# Patient Record
Sex: Male | Born: 1990 | Race: White | Hispanic: No | Marital: Single | Smoking: Never smoker
Health system: Southern US, Community
[De-identification: ages and names within clinical notes are randomized; demographics above are authoritative.]

## PROBLEM LIST (undated history)

## (undated) DIAGNOSIS — Q7962 Hypermobile Ehlers-Danlos syndrome: Secondary | ICD-10-CM

## (undated) DIAGNOSIS — I10 Essential (primary) hypertension: Secondary | ICD-10-CM

## (undated) DIAGNOSIS — G629 Polyneuropathy, unspecified: Secondary | ICD-10-CM

## (undated) DIAGNOSIS — M25561 Pain in right knee: Secondary | ICD-10-CM

## (undated) DIAGNOSIS — R200 Anesthesia of skin: Secondary | ICD-10-CM

## (undated) DIAGNOSIS — M249 Joint derangement, unspecified: Secondary | ICD-10-CM

## (undated) DIAGNOSIS — R0689 Other abnormalities of breathing: Secondary | ICD-10-CM

## (undated) DIAGNOSIS — E669 Obesity, unspecified: Secondary | ICD-10-CM

## (undated) DIAGNOSIS — E66811 Obesity, class 1: Secondary | ICD-10-CM

## (undated) DIAGNOSIS — M542 Cervicalgia: Secondary | ICD-10-CM

## (undated) DIAGNOSIS — M549 Dorsalgia, unspecified: Secondary | ICD-10-CM

## (undated) DIAGNOSIS — G8929 Other chronic pain: Secondary | ICD-10-CM

## (undated) DIAGNOSIS — F411 Generalized anxiety disorder: Secondary | ICD-10-CM

## (undated) DIAGNOSIS — M25562 Pain in left knee: Secondary | ICD-10-CM

## (undated) DIAGNOSIS — M5412 Radiculopathy, cervical region: Secondary | ICD-10-CM

## (undated) DIAGNOSIS — J309 Allergic rhinitis, unspecified: Secondary | ICD-10-CM

## (undated) DIAGNOSIS — S83003A Unspecified subluxation of unspecified patella, initial encounter: Secondary | ICD-10-CM

## (undated) HISTORY — DX: Anesthesia of skin: R20.0

## (undated) HISTORY — DX: Unspecified subluxation of unspecified patella, initial encounter: S83.003A

## (undated) HISTORY — DX: Pain in left knee: M25.562

## (undated) HISTORY — DX: Cervicalgia: M54.2

## (undated) HISTORY — DX: Obesity, unspecified: E66.9

## (undated) HISTORY — PX: MENISCUS REPAIR: SHX5179

## (undated) HISTORY — DX: Obesity, class 1: E66.811

## (undated) HISTORY — DX: Radiculopathy, cervical region: M54.12

## (undated) HISTORY — DX: Joint derangement, unspecified: M24.9

## (undated) HISTORY — DX: Other abnormalities of breathing: R06.89

## (undated) HISTORY — DX: Allergic rhinitis, unspecified: J30.9

## (undated) HISTORY — DX: Dorsalgia, unspecified: M54.9

## (undated) HISTORY — DX: Other chronic pain: G89.29

## (undated) HISTORY — DX: Polyneuropathy, unspecified: G62.9

## (undated) HISTORY — DX: Pain in right knee: M25.561

## (undated) HISTORY — DX: Generalized anxiety disorder: F41.1

---
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
## 2012-05-27 DIAGNOSIS — M228X9 Other disorders of patella, unspecified knee: Secondary | ICD-10-CM | POA: Insufficient documentation

## 2012-05-27 DIAGNOSIS — M249 Joint derangement, unspecified: Secondary | ICD-10-CM | POA: Insufficient documentation

## 2012-05-27 DIAGNOSIS — E669 Obesity, unspecified: Secondary | ICD-10-CM | POA: Insufficient documentation

## 2012-06-17 DIAGNOSIS — M25376 Other instability, unspecified foot: Secondary | ICD-10-CM | POA: Insufficient documentation

## 2012-09-03 DIAGNOSIS — M25369 Other instability, unspecified knee: Secondary | ICD-10-CM | POA: Insufficient documentation

## 2012-10-06 ENCOUNTER — Emergency Department: Payer: Self-pay | Admitting: Internal Medicine

## 2012-10-06 LAB — URINALYSIS, COMPLETE
Bilirubin,UR: NEGATIVE
Glucose,UR: NEGATIVE mg/dL (ref 0–75)
Leukocyte Esterase: NEGATIVE
Nitrite: NEGATIVE
Ph: 5 (ref 4.5–8.0)
RBC,UR: 1 /HPF (ref 0–5)
Specific Gravity: 1.031 (ref 1.003–1.030)

## 2012-10-06 LAB — CBC
HCT: 46.6 % (ref 40.0–52.0)
HGB: 15.6 g/dL (ref 13.0–18.0)
MCHC: 33.5 g/dL (ref 32.0–36.0)

## 2012-10-06 LAB — COMPREHENSIVE METABOLIC PANEL
Albumin: 3.8 g/dL (ref 3.4–5.0)
Alkaline Phosphatase: 73 U/L (ref 50–136)
BUN: 14 mg/dL (ref 7–18)
Bilirubin,Total: 0.4 mg/dL (ref 0.2–1.0)
Calcium, Total: 9 mg/dL (ref 8.5–10.1)
Co2: 27 mmol/L (ref 21–32)
EGFR (African American): >60
Osmolality: 279 (ref 275–301)
Potassium: 4.2 mmol/L (ref 3.5–5.1)

## 2013-03-10 DIAGNOSIS — S83003A Unspecified subluxation of unspecified patella, initial encounter: Secondary | ICD-10-CM | POA: Insufficient documentation

## 2013-03-10 DIAGNOSIS — F411 Generalized anxiety disorder: Secondary | ICD-10-CM | POA: Insufficient documentation

## 2013-05-01 DIAGNOSIS — R200 Anesthesia of skin: Secondary | ICD-10-CM | POA: Insufficient documentation

## 2013-06-16 DIAGNOSIS — G629 Polyneuropathy, unspecified: Secondary | ICD-10-CM | POA: Insufficient documentation

## 2013-07-02 DIAGNOSIS — Q796 Ehlers-Danlos syndrome, unspecified: Secondary | ICD-10-CM | POA: Insufficient documentation

## 2013-07-23 DIAGNOSIS — M549 Dorsalgia, unspecified: Secondary | ICD-10-CM | POA: Insufficient documentation

## 2013-07-23 DIAGNOSIS — M542 Cervicalgia: Secondary | ICD-10-CM | POA: Insufficient documentation

## 2013-07-23 DIAGNOSIS — M40204 Unspecified kyphosis, thoracic region: Secondary | ICD-10-CM | POA: Insufficient documentation

## 2013-07-25 DIAGNOSIS — M25561 Pain in right knee: Secondary | ICD-10-CM

## 2013-07-25 DIAGNOSIS — M25562 Pain in left knee: Secondary | ICD-10-CM

## 2013-07-25 DIAGNOSIS — G8929 Other chronic pain: Secondary | ICD-10-CM | POA: Insufficient documentation

## 2013-07-25 DIAGNOSIS — M25361 Other instability, right knee: Secondary | ICD-10-CM | POA: Insufficient documentation

## 2014-08-04 ENCOUNTER — Emergency Department
Admission: EM | Admit: 2014-08-04 | Discharge: 2014-08-04 | Disposition: A | Discharge status: Home / Self Care | Payer: Self-pay | Attending: Emergency Medicine | Admitting: Emergency Medicine

## 2014-08-04 ENCOUNTER — Encounter: Payer: Self-pay | Admitting: Emergency Medicine

## 2014-08-04 ENCOUNTER — Emergency Department: Payer: Self-pay

## 2014-08-04 DIAGNOSIS — R200 Anesthesia of skin: Secondary | ICD-10-CM | POA: Insufficient documentation

## 2014-08-04 DIAGNOSIS — I10 Essential (primary) hypertension: Secondary | ICD-10-CM | POA: Insufficient documentation

## 2014-08-04 DIAGNOSIS — M542 Cervicalgia: Secondary | ICD-10-CM | POA: Insufficient documentation

## 2014-08-04 DIAGNOSIS — Z79899 Other long term (current) drug therapy: Secondary | ICD-10-CM | POA: Insufficient documentation

## 2014-08-04 DIAGNOSIS — Z880 Allergy status to penicillin: Secondary | ICD-10-CM | POA: Insufficient documentation

## 2014-08-04 HISTORY — DX: Hypermobile Ehlers-Danlos syndrome: Q79.62

## 2014-08-04 HISTORY — DX: Essential (primary) hypertension: I10

## 2014-08-04 LAB — BASIC METABOLIC PANEL
ANION GAP: 5 (ref 5–15)
BUN: 12 mg/dL (ref 6–20)
CHLORIDE: 106 mmol/L (ref 101–111)
CO2: 30 mmol/L (ref 22–32)
CREATININE: 1.21 mg/dL (ref 0.61–1.24)
Calcium: 9.1 mg/dL (ref 8.9–10.3)
GFR calc Af Amer: >60 mL/min (ref >60)
GFR calc non Af Amer: >60 mL/min (ref >60)
Glucose, Bld: 88 mg/dL (ref 65–99)
Potassium: 4.4 mmol/L (ref 3.5–5.1)
SODIUM: 141 mmol/L (ref 135–145)

## 2014-08-04 NOTE — ED Provider Notes (Signed)
[facility] Emergency Department Provider Note  ____________________________________________  Time seen: Approximately 5:04 PM  I have reviewed the triage vital signs and the nursing notes.   HISTORY  Chief Complaint Neck Pain and Numbness   HPI Delawrence Fridman is a 24 y.o. 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 He has had multiple joint injuries and compression fractures in his spine in the past. He woke up 3 days ago with pain in his neck and left arm numbness and tingling like he went to sleep. It has not responded over service treatment at home over the last 3 days. Patient does have a prior history of facet joint impingement in the neck. Grandparents are worried about further injury involving the nerves or spinal cord and his neck due to his illness. Patient has pain on raising his hand over his head. There is no apparent weakness.   Past Medical History  Diagnosis Date  . Ehlers-Danlos syndrome type III   . Hypertension     There are no active problems to display for this patient.   History reviewed. No pertinent past surgical history.  Current Outpatient Rx  Name  Route  Sig  Dispense  Refill  . budesonide (RHINOCORT ALLERGY) 32 MCG/ACT nasal spray   Each Nare   Place 1 spray into both nostrils daily.         Marland Kitchen desloratadine (CLARINEX) 5 MG tablet   Oral   Take 5 mg by mouth daily.         Marland Kitchen ibuprofen (ADVIL,MOTRIN) 200 MG tablet   Oral   Take 400 mg by mouth every 6 (six) hours as needed for headache or mild pain.         . montelukast (SINGULAIR) 10 MG tablet   Oral   Take 10 mg by mouth daily.           Allergies Augmentin  History reviewed. No pertinent family history.  Social History History  Substance Use Topics  . Smoking status: Never Smoker   . Smokeless tobacco: Not on file  . Alcohol Use: No    Review of Systems Constitutional: No  fever/chills Eyes: No visual changes. ENT: No sore throat. Cardiovascular: Denies chest pain. Respiratory: Denies shortness of breath. Gastrointestinal: No abdominal pain.  No nausea, no vomiting.  No diarrhea.  No constipation. Genitourinary: Negative for dysuria. Musculoskeletal: Negative for back pain. Skin: Negative for rash. Neurological: Negative for headaches, focal weakness or numbness.  10-point ROS otherwise negative.  ____________________________________________   PHYSICAL EXAM:  VITAL SIGNS: ED Triage Vitals  Enc Vitals Group     BP 08/04/14 1559 120/70 mmHg     Pulse Rate 08/04/14 1559 81     Resp 08/04/14 1559 18     Temp 08/04/14 1559 98.4 F (36.9 C)     Temp Source 08/04/14 1559 Oral     SpO2 08/04/14 1559 98 %     Weight 08/04/14 1559 284 lb (128.822 kg)     Height 08/04/14 1559  (1.854 m)     Head Cir --      Peak Flow --      Pain Score 08/04/14 1600 6     Pain Loc --      Pain Edu? --      Excl. in GC? --     Constitutional: Alert and oriented. Well appearing and in no acute distress. Eyes: Conjunctivae are normal.  PERRL. EOMI. Head: Atraumatic. Nose: No congestion/rhinnorhea. Mouth/Throat: Mucous membranes are moist.  Oropharynx non-erythematous. Neck: No stridor. Patient has tenderness in the midline on palpation of his neck at approximately the C4 area Cardiovascular: Normal rate, regular rhythm. Grossly normal heart sounds.  Good peripheral circulation. Respiratory: Normal respiratory effort.  No retractions. Lungs CTAB. Gastrointestinal: Soft and nontender. No distention. No abdominal bruits. No CVA tenderness. Musculoskeletal: No lower extremity tenderness nor edema.  No joint effusions. Neurologic:  Normal speech and language. No gross focal neurologic deficits are appreciated especially in the arms and hands. Speech is normal. No gait instability. Skin:  Skin is warm, dry and intact. No rash noted. Psychiatric: Mood and affect are  normal. Speech and behavior are normal.  ____________________________________________   LABS (all labs ordered are listed, but only abnormal results are displayed)  Labs Reviewed  BASIC METABOLIC PANEL   ____________________________________________  EKG   ____________________________________________  RADIOLOGY  MRI findings reviewed and discussed with Dr. Arlana Pouchate is [facility] she feels most likely this is a cord contusion and she feels that would most likely be evaluated by neurosurgery and if there is no ligamentous or bony injury probably nothing would be done. She thinks she would be very high-grade coincidence if this was a transverse myelitis after the positioning that mom described to me just before I called her where he had to lie on his side in the gurney with his left hand under his neck during a angiogram kind of procedure the day before this numbness started. Mother and father have to go to get insulin for the father to [facility] and will make him back I will discuss with them the fact that [facility] on [facility] due to an ER surge problem and see if they want to go to [facility] or [facility] or [facility] or what    ____________________________________________   PROCEDURES  [facility]  ____________________________________________   INITIAL IMPRESSION / ASSESSMENT AND PLAN / ED COURSE  Pertinent labs & imaging results that were available during my care of the patient were reviewed by me and considered in my medical decision making (see chart for details). Discussed in detail with Dr. Leonard [facility]su [facility] he says the patient can follow-up as an outpatient if the only complaints are numbness this was confirmed with the patient and family again and confirmed by my exam he says the patient does not need a neck brace unless he wants to wear one for comfort. He can follow up with the neurosurgery clinic  as an outpatient I obtained a phone number which opened the discharge instructions [facility]  ____________________________________________   FINAL CLINICAL IMPRESSION(S) / ED DIAGNOSES  Final diagnoses:  Arm numbness left      Arnaldo NatalPaul F Malinda, MD 08/04/14 2201

## 2014-08-04 NOTE — ED Notes (Signed)
Dr. Darnelle CatalanMalinda in room to discuss patient's status on transferring to [facility]. Waiting at this time to see if ED is off diversion.

## 2014-08-04 NOTE — Discharge Instructions (Signed)
Please return for further increases in pain or any weakness of the arms. Call [phone] which should be the neurosurgery outpatient clinic and arrange follow[facility]. Please tell them that she was seen in the emergency room [facility] and that the doctor there Dr. Darnelle CatalanMalinda spoke with Dr. Arlana Pouchate in neurology and Dr. Raynald KempHsu  neurosurgery and it was recommended by the neurosurgeon that you follow up in the outpatient clinic for neurosurgery as soon as possible. He feels that the condition since it has been going on for 3 days and there is no muscle weakness you her condition is stable at this time. He feels you do not need to wear a neck brace. If you do get any more numbness or if you get any weakness you need to return to the emergency room at once. He can use Tylenol or Motrin or another non-steroidal like Naprosyn if needed for the discomfort you're having. When you go to see the neurosurgery clinic please take the information from Dr. Peggye FormJewett about Ehlers-Danlos syndrome with you.

## 2014-08-04 NOTE — ED Notes (Signed)
Patient transported to MRI 

## 2014-08-04 NOTE — ED Notes (Signed)
Pt to ed with c/o left arm numbness and neck pain that he awoke with 3 days ago.  Pt denies known injury but has hx of ehlers-danlos and states he could have an injury and not be aware of it.

## 2014-08-07 ENCOUNTER — Telehealth: Payer: Self-pay | Admitting: Emergency Medicine

## 2014-08-07 NOTE — ED Notes (Signed)
Received call from patient's mother on 7/6 requesting referral to [facility] neuro surgery.  Called Dr. Denyse AmassHsu's practice and they do not connect with us on epic at this time.  Faxed notes to Dr. Raynald KempHsu, and office staff will give the notes to him when he is out of surgery.   This am patient's father called, and they have not heard from [facility] about seeing patient.  I told him I had faxed the information ( on 7/6 and received a confirmation.  I told him I would fax the information again.   He called me back and says [facility] said they have centralized fax and have not received the one from 7/6 until it is distributed to them.  Father very frustrated.  I called [facility]urg again and they gave me their office fax [phone] so they can have dr hsu review and decide on follow up plans.  i explained to the office that patietn and family need reassurance if a follow up with Dr. Raynald KempHsu is to be delayed, as the patient was going to be transferred to [facility] from here, but was not due to surge at [facility].

## 2014-10-26 DIAGNOSIS — M5412 Radiculopathy, cervical region: Secondary | ICD-10-CM | POA: Insufficient documentation

## 2015-02-10 DIAGNOSIS — R0689 Other abnormalities of breathing: Secondary | ICD-10-CM | POA: Insufficient documentation

## 2015-06-29 DIAGNOSIS — I7300 Raynaud's syndrome without gangrene: Secondary | ICD-10-CM | POA: Insufficient documentation

## 2016-04-14 ENCOUNTER — Encounter: Payer: Self-pay | Admitting: Emergency Medicine

## 2016-04-14 ENCOUNTER — Emergency Department
Admission: EM | Admit: 2016-04-14 | Discharge: 2016-04-14 | Disposition: A | Discharge status: Home / Self Care | Payer: Self-pay | Attending: Emergency Medicine | Admitting: Emergency Medicine

## 2016-04-14 ENCOUNTER — Emergency Department: Payer: Self-pay

## 2016-04-14 DIAGNOSIS — Z79899 Other long term (current) drug therapy: Secondary | ICD-10-CM | POA: Insufficient documentation

## 2016-04-14 DIAGNOSIS — K429 Umbilical hernia without obstruction or gangrene: Secondary | ICD-10-CM | POA: Insufficient documentation

## 2016-04-14 DIAGNOSIS — I10 Essential (primary) hypertension: Secondary | ICD-10-CM | POA: Insufficient documentation

## 2016-04-14 LAB — COMPREHENSIVE METABOLIC PANEL
ALT: 44 U/L (ref 17–63)
ANION GAP: 6 (ref 5–15)
AST: 28 U/L (ref 15–41)
Albumin: 4.3 g/dL (ref 3.5–5.0)
Alkaline Phosphatase: 56 U/L (ref 38–126)
BUN: 13 mg/dL (ref 6–20)
CHLORIDE: 104 mmol/L (ref 101–111)
CO2: 28 mmol/L (ref 22–32)
CREATININE: 1.04 mg/dL (ref 0.61–1.24)
Calcium: 9.1 mg/dL (ref 8.9–10.3)
GFR calc non Af Amer: >60 mL/min (ref >60)
Glucose, Bld: 94 mg/dL (ref 65–99)
Potassium: 4 mmol/L (ref 3.5–5.1)
Sodium: 138 mmol/L (ref 135–145)
TOTAL PROTEIN: 7.9 g/dL (ref 6.5–8.1)
Total Bilirubin: 0.8 mg/dL (ref 0.3–1.2)

## 2016-04-14 LAB — CBC WITH DIFFERENTIAL/PLATELET
Basophils Absolute: 0.1 10*3/uL (ref 0–0.1)
Basophils Relative: 1 %
EOS ABS: 0 10*3/uL (ref 0–0.7)
EOS PCT: 0 %
HCT: 47.7 % (ref 40.0–52.0)
Hemoglobin: 16.1 g/dL (ref 13.0–18.0)
LYMPHS ABS: 3.2 10*3/uL (ref 1.0–3.6)
Lymphocytes Relative: 27 %
MCH: 28 pg (ref 26.0–34.0)
MCHC: 33.8 g/dL (ref 32.0–36.0)
MCV: 82.8 fL (ref 80.0–100.0)
MONO ABS: 1 10*3/uL (ref 0.2–1.0)
MONOS PCT: 8 %
Neutro Abs: 7.6 10*3/uL — ABNORMAL HIGH (ref 1.4–6.5)
Neutrophils Relative %: 64 %
PLATELETS: 211 10*3/uL (ref 150–440)
RBC: 5.76 MIL/uL (ref 4.40–5.90)
RDW: 14.1 % (ref 11.5–14.5)
WBC: 12 10*3/uL — AB (ref 3.8–10.6)

## 2016-04-14 LAB — LIPASE, BLOOD: LIPASE: 12 U/L (ref 11–51)

## 2016-04-14 MED ORDER — IOPAMIDOL (ISOVUE-300) INJECTION 61%
30.0000 mL | Freq: Once | INTRAVENOUS | Status: AC
  Administered 2016-04-14: 30 mL via ORAL
  Filled 2016-04-14: qty 30

## 2016-04-14 MED ORDER — CEPHALEXIN 500 MG PO CAPS: 500.0000 mg | ORAL_CAPSULE | Freq: Four times a day (QID) | ORAL | 0 refills | Status: AC

## 2016-04-14 MED ORDER — IOPAMIDOL (ISOVUE-300) INJECTION 61%
125.0000 mL | Freq: Once | INTRAVENOUS | Status: AC | PRN
  Administered 2016-04-14: 125 mL via INTRAVENOUS
  Filled 2016-04-14: qty 150

## 2016-04-14 MED ORDER — KETOROLAC TROMETHAMINE 30 MG/ML IJ SOLN
15.0000 mg | Freq: Once | INTRAMUSCULAR | Status: AC
  Administered 2016-04-14: 15 mg via INTRAVENOUS
  Filled 2016-04-14: qty 1

## 2016-04-14 NOTE — ED Notes (Addendum)
Pt states pain in the umbilicus started 2 days ago, pt states feeling a mass behind the umbilicus; pt states umbilicus started bleeding and yellow discharge today. Pt states pain at 7/10. Pt has Ehlers-Danlos Syndrome.

## 2016-04-14 NOTE — ED Provider Notes (Signed)
Time Seen: Approximately 2012  I have reviewed the triage notes  Chief Complaint: Wound Infection   History of Present Illness: Brad Parker is a 26 y.o. male  who presents with some periumbilical abdominal pain over the last 2 days and is noticed a somewhat bloody dried drainage from the umbilicus. He denies any trauma. He states the pain is rated around the umbilicus and has a tendency to descend and points to an area couple of centimeters below the umbilicus. He denies any fever or malaise aware of, no nausea or vomiting.  Past Medical History:  Diagnosis Date  . Ehlers-Danlos syndrome type III   . Hypertension     There are no active problems to display for this patient.   History reviewed. No pertinent surgical history.  History reviewed. No pertinent surgical history.  Current Outpatient Rx  . Order #: [id number] Class: Historical Med  . Order #: [id number] Class: Historical Med  . Order #: [id number] Class: Historical Med  . Order #: [id number] Class: Historical Med    Allergies:  Augmentin [amoxicillin-pot clavulanate]  Family History: History reviewed. No pertinent family history.  Social History: Social History  Substance Use Topics  . Smoking status: Never Smoker  . Smokeless tobacco: Never Used  . Alcohol use No     Review of Systems:   10 point review of systems was performed and was otherwise negative:  Constitutional: No fever Eyes: No visual disturbances ENT: No sore throat, ear pain Cardiac: No chest pain Respiratory: No shortness of breath, wheezing, or stridor Abdomen: Periumbilical abdominal pain without any lower or upper abdominal discomfort. No diarrhea, etc. Endocrine: No weight loss, No night sweats Extremities: No peripheral edema, cyanosis Skin: No rashes, easy bruising Neurologic: No focal weakness, trouble with speech or swollowing Urologic: No dysuria, Hematuria, or urinary frequency *  Physical Exam:  ED Triage Vitals   Enc Vitals Group     BP 04/14/16 1956 125/85     Pulse Rate 04/14/16 1956 67     Resp 04/14/16 1956 20     Temp 04/14/16 1956 98.9 F (37.2 C)     Temp Source 04/14/16 1956 Oral     SpO2 04/14/16 1956 98 %     Weight 04/14/16 2000 300 lb (136.1 kg)     Height 04/14/16 2000 6\' 2"  (1.88 m)     Head Circumference --      Peak Flow --      Pain Score 04/14/16 2000 7     Pain Loc --      Pain Edu? --      Excl. in GC? --     General: Awake , Alert , and Oriented times 3; GCS 15 Head: Normal cephalic , atraumatic Eyes: Pupils equal , round, reactive to light Nose/Throat: No nasal drainage, patent upper airway without erythema or exudate.  Neck: Supple, Full range of motion, No anterior adenopathy or palpable thyroid masses Lungs: Clear to ascultation without wheezes , rhonchi, or rales Heart: Regular rate, regular rhythm without murmurs , gallops , or rubs Abdomen: Soft some mild tenderness with what appears to be old serosanguineous type blood around the umbilicus no bright red blood or ecchymosis noted no rebound, guarding, or rigidity. No focal tenderness over McBurney's point. Negative Murphy's sign .        Extremities: 2 plus symmetric pulses. No edema, clubbing or cyanosis Neurologic: normal ambulation, Motor symmetric without deficits, sensory intact Skin: warm, dry, no rashes   Labs:  All laboratory work was reviewed including any pertinent negatives or positives listed below:  Labs Reviewed  CBC WITH DIFFERENTIAL/PLATELET - Abnormal; Notable for the following:       Result Value   WBC 12.0 (*)    Neutro Abs 7.6 (*)    All other components within normal limits  COMPREHENSIVE METABOLIC PANEL  LIPASE, BLOOD  Laboratory work was reviewed and showed no clinically significant abnormalities.  Radiology: "Ct Abdomen Pelvis W Contrast  Result Date: 04/14/2016 CLINICAL DATA:  Initial evaluation for acute umbilical abdominal pain, drainage. EXAM: CT ABDOMEN AND PELVIS WITH  CONTRAST TECHNIQUE: Multidetector CT imaging of the abdomen and pelvis was performed using the standard protocol following bolus administration of intravenous contrast. CONTRAST:  125mL ISOVUE-300 IOPAMIDOL (ISOVUE-300) INJECTION 61% COMPARISON:  Prior CT from 05/16/2011. FINDINGS: Lower chest: Visualized lung bases are clear. Hepatobiliary: Diffuse hypoattenuation liver suggestive of steatosis. Liver otherwise unremarkable. Gallbladder normal. No biliary dilatation. Pancreas: Pancreas within normal limits. Apparent subcentimeter hypodensity at the pancreatic tail favored to reflect volume averaging with adjacent peripancreatic fat. Spleen: Spleen within normal limits. Adrenals/Urinary Tract: Adrenal glands are normal. Kidneys equal in size with symmetric enhancement. No nephrolithiasis, hydronephrosis, or focal enhancing renal mass. No hydroureter. Bladder within normal limits. Stomach/Bowel: Stomach normal. No evidence for bowel obstruction. Appendix within normal limits. No acute inflammatory changes seen about the bowels. Vascular/Lymphatic: Normal intravascular enhancement seen throughout the intra-abdominal aorta and its branch vessels. No adenopathy. Reproductive: Prostate normal. Other: No free air or fluid. Tiny paraumbilical fat containing hernia without associated inflammation. Musculoskeletal: No acute osseous abnormality. No worrisome lytic or blastic osseous lesions. IMPRESSION: 1. No CT evidence for acute intra-abdominal or pelvic process. 2. Tiny fat containing paraumbilical hernia without associated inflammation. 3. Hepatic steatosis. Electronically Signed   By: Rise MuBenjamin  McClintock M.D.   On: 04/14/2016 22:21  "  I personally reviewed the radiologic studies     ED Course:  Patient's stay here was uneventful and received medication with symptomatic improvement. He does not appear to have any signs of a abscess or infected tract from the umbilicus. Most likely a periumbilical hernia which is  easily reduced at this time and there's no signs of entrapment or incarceration. He was placed on medication for cellulitis since I cannot entirely evaluate his umbilicus and there were given tips on how to keep this area clean with Q-tips and hydrogen peroxide.     Assessment:  Periumbilical hernia     Plan:  Outpatient " Discharge Medication List as of 04/14/2016 10:36 PM    START taking these medications   Details  cephALEXin (KEFLEX) 500 MG capsule Take 1 capsule (500 mg total) by mouth 4 (four) times daily., Starting Fri 04/14/2016, Until Mon 04/24/2016, Print      " Patient was advised to return immediately if condition worsens. Patient was advised to follow up with their primary care physician or other specialized physicians involved in their outpatient care. The patient and/or family member/power of attorney had laboratory results reviewed at the bedside. All questions and concerns were addressed and appropriate discharge instructions were distributed by the nursing staff.             Jennye MoccasinBrian S Lorilynn Lehr, MD 04/16/16 225 [phone]

## 2016-04-14 NOTE — Discharge Instructions (Signed)
Please return immediately if condition worsens. Please contact her primary physician or the physician you were given for referral. If you have any specialist physicians involved in her treatment and plan please also contact them. Thank you for using  regional emergency Department. ° °

## 2016-04-14 NOTE — ED Notes (Signed)
Assisted pt to the bathroom; pt requested use of a wheelchair to and from the restroom; pt is able to walk using a cane, pt displays a steady and balanced gait while ambulating from the wheelchair to the commode.

## 2016-04-14 NOTE — ED Triage Notes (Signed)
Pt presents w/ c/o pain in umbilicus. Pt has bloody drainage that started today in umbilicus. Pt has no other sxs associated with this particular possible infection.

## 2016-04-14 NOTE — ED Notes (Signed)

## 2016-05-10 ENCOUNTER — Ambulatory Visit (INDEPENDENT_AMBULATORY_CARE_PROVIDER_SITE_OTHER): Payer: Self-pay | Admitting: General Surgery

## 2016-05-10 ENCOUNTER — Encounter: Payer: Self-pay | Admitting: General Surgery

## 2016-05-10 VITALS — BP 136/83 | HR 77 | Temp 98.1°F | Ht 74.0 in | Wt 314.0 lb

## 2016-05-10 DIAGNOSIS — K429 Umbilical hernia without obstruction or gangrene: Secondary | ICD-10-CM

## 2016-05-10 DIAGNOSIS — L0591 Pilonidal cyst without abscess: Secondary | ICD-10-CM

## 2016-05-10 NOTE — Progress Notes (Signed)
Patient ID: Brad Parker, male   DOB: 01-22-1991, 26 y.o.   MRN: [id number]  CC: umbilical hernia  HPI Brad Parker is a 26 y.o. male who presents to clinic for evaluation of an umbilical hernia. He was seen in the ER for this approximately 1 month ago was noted have a bloody drainage from the umbilicus. Patient was prescribed a course of antibiotics and the drainage stops. He denies any current discomfort to the area or drainage. He has had 1 episode of nausea during the attempted bowel movement. He states he's had chronic constipation. All of his complaints are for multiple weeks ago. He currently denies any fever, chills, nausea, vomiting, chest pain, shortness of breath, diarrhea, constipation. He has multiple lower extremity complaints and came into the clinic in a wheelchair.  HPI  Past Medical History:  Diagnosis Date  . Allergic rhinitis   . Bilateral chronic knee pain   . Ehlers-Danlos syndrome type III   . Generalized anxiety disorder   . Hypermobile joints   . Hypertension   . Irregular breathing pattern   . Mid back pain   . Neck pain   . Neuropathy (HCC)   . Numbness of feet   . Obesity (BMI 30.0-34.9)   . Patellar subluxation   . Radiculopathy of cervical region     Past Surgical History:  Procedure Laterality Date  . MENISCUS REPAIR Bilateral Left- 05/2008 and Right 09/2008    Family History  Problem Relation Age of Onset  . Ehlers-Danlos syndrome Mother   . Diabetes Father   . Colon cancer Sister   . Ehlers-Danlos syndrome Maternal Aunt     Social History Social History  Substance Use Topics  . Smoking status: Never Smoker  . Smokeless tobacco: Never Used  . Alcohol use No    Allergies  Allergen Reactions  . Augmentin [Amoxicillin-Pot Clavulanate] Other (See Comments)    Myalgia  . Other Cough    Dogs, Trees, grasses, pollen, ragweed, mold- swelling, congestion, sneezing, runny nose, itchy throat.     Current Outpatient  Prescriptions  Medication Sig Dispense Refill  . budesonide (RHINOCORT ALLERGY) 32 MCG/ACT nasal spray Place 1 spray into both nostrils daily.    Marland Kitchen desloratadine (CLARINEX) 5 MG tablet Take 5 mg by mouth daily.    Marland Kitchen ibuprofen (ADVIL,MOTRIN) 200 MG tablet Take 400 mg by mouth every 6 (six) hours as needed for headache or mild pain.    . montelukast (SINGULAIR) 10 MG tablet Take 10 mg by mouth daily.     No current facility-administered medications for this visit.      Review of Systems A Multi-point review of systems was asked and was negative except for the findings documented in the history of present illness  Physical Exam Blood pressure 136/83, pulse 77, temperature 98.1 F (36.7 C), temperature source Oral, height  (1.88 m), weight (!) 142.4 kg (314 lb). CONSTITUTIONAL: No acute distress. EYES: Pupils are equal, round, and reactive to light, Sclera are non-icteric. EARS, NOSE, MOUTH AND THROAT: The oropharynx is clear. The oral mucosa is pink and moist. Hearing is intact to voice. LYMPH NODES:  Lymph nodes in the neck are normal. RESPIRATORY:  Lungs are clear. There is normal respiratory effort, with equal breath sounds bilaterally, and without pathologic use of accessory muscles. CARDIOVASCULAR: Heart is regular without murmurs, gallops, or rubs. GI: The abdomen is soft, nontender, and nondistended. There are no palpable masses. There is no hepatosplenomegaly. There are normal  bowel sounds in all quadrants. The umbilicus had to be spread with Q-tips nor to visualize the base. Multiple loose hairs were identified going into what appears to be a pilonidal cyst. No evidence of infection on exam today. GU: Rectal deferred.   MUSCULOSKELETAL: Normal muscle strength and tone. No cyanosis or edema.   SKIN: Turgor is good and there are no pathologic skin lesions or ulcers. NEUROLOGIC: Motor and sensation is grossly normal. Cranial nerves are grossly intact. PSYCH:  Oriented to person,  place and time. Affect is normal.  Data Reviewed CT scan from the ER reviewed which showed a small umbilical hernia. Measures 4 mm in diameter. No recent or pertinent labs to review I have personally reviewed the patient's imaging, laboratory findings and medical records.    Assessment    Pilonidal cyst within the umbilicus, umbilical hernia.    Plan    26 year old male that is obese and with Ehlers-Danlos syndrome in clinic today for evaluation of an umbilical pilonidal cyst and umbilical hernia. Discussed the diagnosis at length with the patient and his parents. Discussed no indications for surgical intervention for a pilonidal cyst that is not currently infected within the umbilicus. Discussed preventative herpes including keeping the area clean and free of hair. Discussed that even though there is a visualized small umbilical hernia on CT scan his size precludes any elective repair. Discussed should the area become more symptomatic that I would highly recommend continued weight loss. No indications for surgical intervention at this time. They can follow-up on an as-needed basis.     Time spent with the patient was , with more than 50% of the time spent in face-to-face education, counseling and care coordination.     Ricarda Frame, MD FACS General Surgeon 05/10/2016, 4:24 PM

## 2016-05-10 NOTE — Patient Instructions (Signed)
Please call our office if this Cyst begins to flare up once again or if the Hernia becomes problematic- We will work you in with the surgeon.    Pilonidal Cyst A pilonidal cyst is a fluid-filled sac. It forms beneath the skin due to hair follicles.  A pilonidal cyst that is not large or infected may not cause symptoms or problems. If the cyst becomes irritated or infected, it may fill with pus. This causes pain and swelling (pilonidal abscess). An infected cyst may need to be treated with medicine, drained, or removed. What are the causes? The cause of a pilonidal cyst is not known. One cause may be a hair that grows into your skin (ingrown hair). What increases the risk? Pilonidal cysts are more common in boys and men. Risk factors include:  Having lots of hair near the crease of the buttocks.  Being overweight.  Having a pilonidal dimple.  Wearing tight clothing.  Not bathing or showering frequently.  Sitting for long periods of time. What are the signs or symptoms? Signs and symptoms of a pilonidal cyst may include:  Redness.  Pain and tenderness.  Warmth.  Swelling.  Pus.  Fever. How is this diagnosed? Your health care provider may diagnose a pilonidal cyst based on your symptoms and a physical exam. The health care provider may do a blood test to check for infection. If your cyst is draining pus, your health care provider may take a sample of the drainage to be tested at a laboratory. How is this treated? Surgery is the usual treatment for an infected pilonidal cyst. You may also have to take medicines before surgery. The type of surgery you have depends on the size and severity of the infected cyst. The different kinds of surgery include:  Incision and drainage. This is a procedure to open and drain the cyst.  Marsupialization. In this procedure, a large cyst or abscess may be opened and kept open by stitching the edges of the skin to the cyst walls.  Cyst removal.  This procedure involves opening the skin and removing all or part of the cyst. Follow these instructions at home:  Follow all of your surgeon's instructions carefully if you had surgery.  Take medicines only as directed by your health care provider.  If you were prescribed an antibiotic medicine, finish it all even if you start to feel better.  Keep the area around your pilonidal cyst clean and dry.  Clean the area as directed by your health care provider. Pat the area dry with a clean towel. Do not rub it as this may cause bleeding.  Remove hair from the area around the cyst as directed by your health care provider.  Do not wear tight clothing or sit in one place for long periods of time.  There are many different ways to close and cover an incision, including stitches, skin glue, and adhesive strips. Follow your health care provider's instructions on:  Incision care.  Bandage (dressing) changes and removal.  Incision closure removal. Contact a health care provider if:  You have drainage, redness, swelling, or pain at the site of the cyst.  You have a fever. This information is not intended to replace advice given to you by your health care provider. Make sure you discuss any questions you have with your health care provider. Document Released: 01/14/2000 Document Revised: 06/24/2015 Document Reviewed: 06/05/2013 Elsevier Interactive Patient Education  2017 [employer].

## 2016-06-21 ENCOUNTER — Encounter: Payer: Self-pay | Admitting: General Surgery

## 2019-03-13 IMAGING — CT CT ABD-PELV W/ CM
2 of 4 series · 16 of 46 positions shown, 18 images · IV contrast (APPLIED)
Comparison: Prior CT from 05/16/2011.

CLINICAL DATA: Initial evaluation for acute umbilical abdominal
pain, drainage.

EXAM:
CT ABDOMEN AND PELVIS WITH CONTRAST
TECHNIQUE: Multidetector CT imaging of the abdomen and pelvis was performed
using the standard protocol following bolus administration of
intravenous contrast.
CONTRAST:  125mL PD3KZM-RAA IOPAMIDOL (PD3KZM-RAA) INJECTION 61%

[Series 2: routine abd/pel with · axial · 0.96mm/px · z∈[-1021,-516]mm · 13 of 111 slices shown, 15 images]
[im 5/111  soft-tissue]
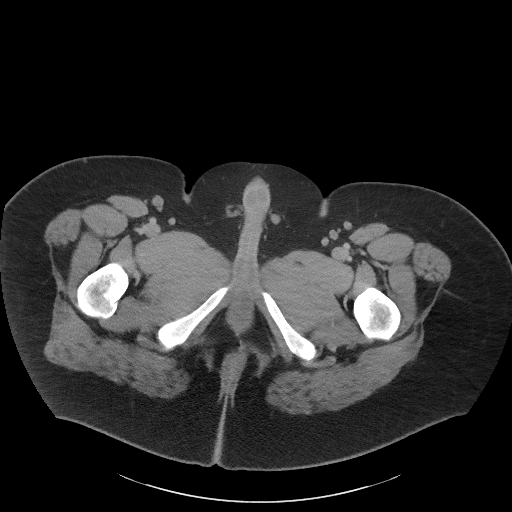
[im 5/111  bone]
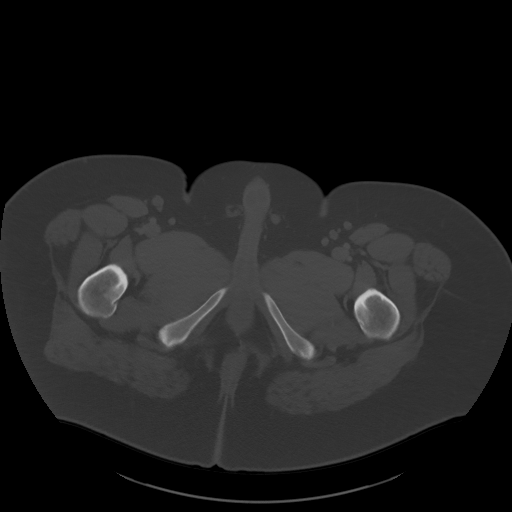
[im 14/111  soft-tissue]
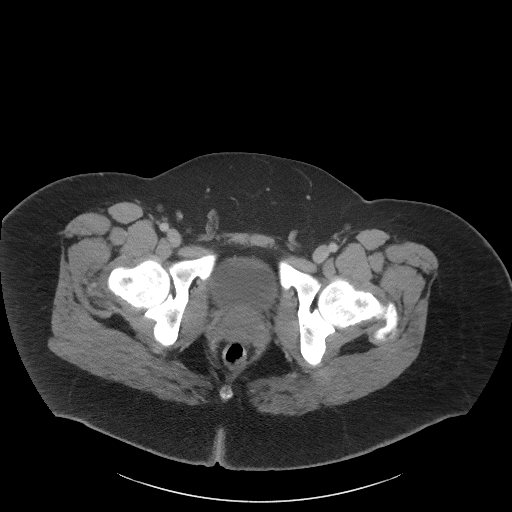
[im 23/111  soft-tissue]
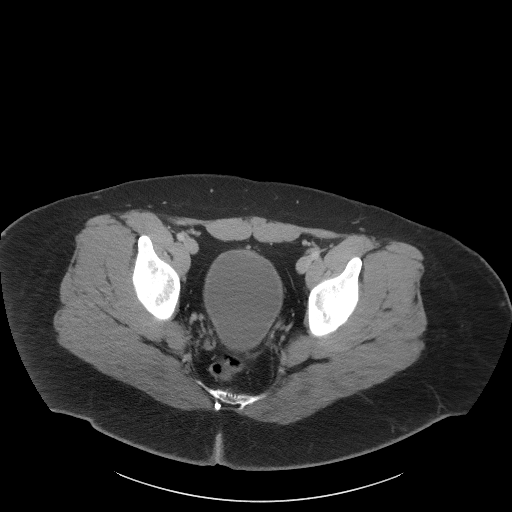
[im 33/111  soft-tissue]
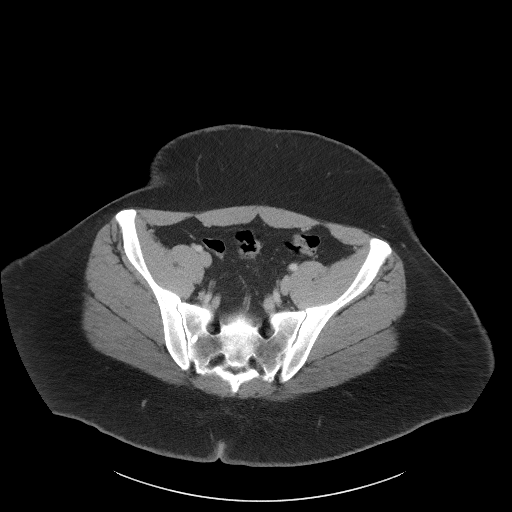
[im 37/111  soft-tissue]
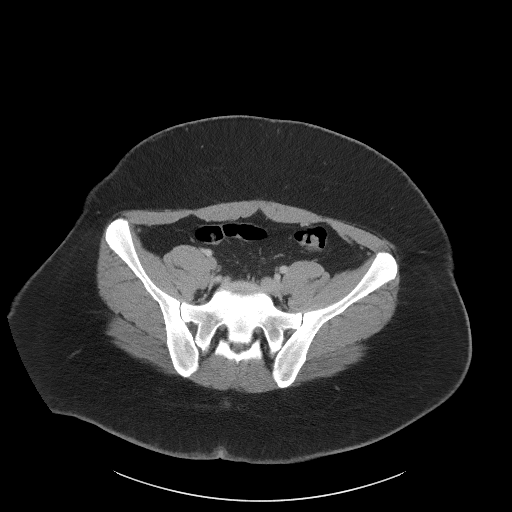
[im 46/111  soft-tissue]
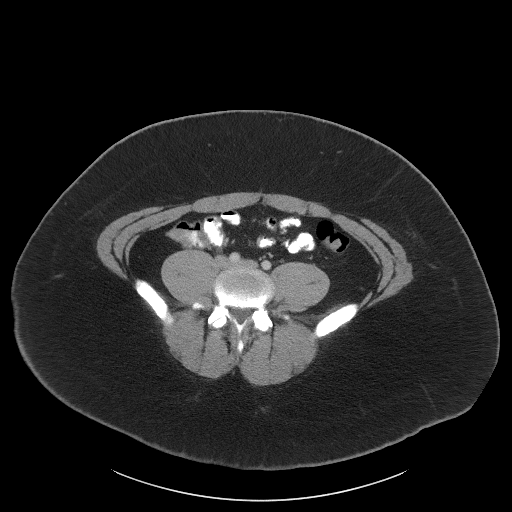
[im 56/111  soft-tissue]
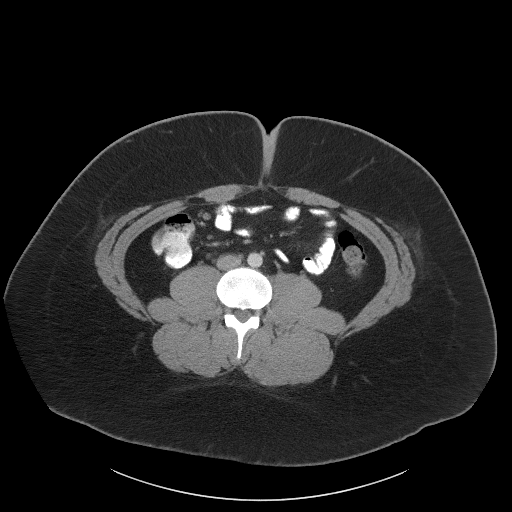
[im 65/111  soft-tissue]
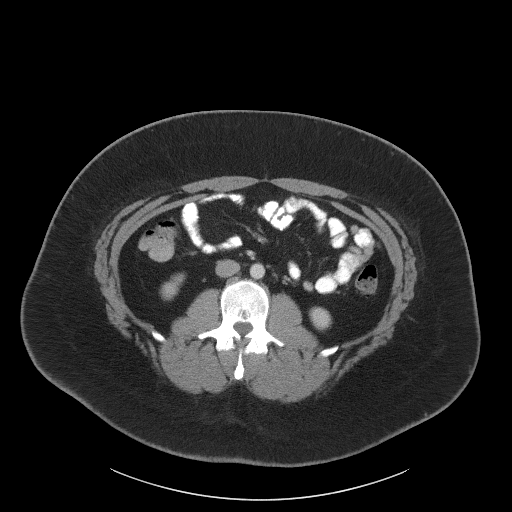
[im 74/111  soft-tissue]
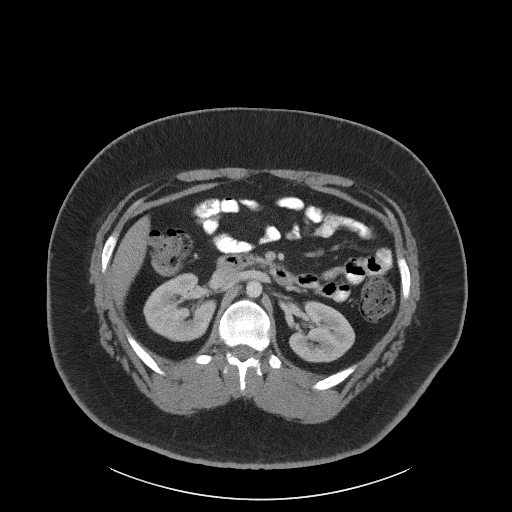
[im 74/111  bone]
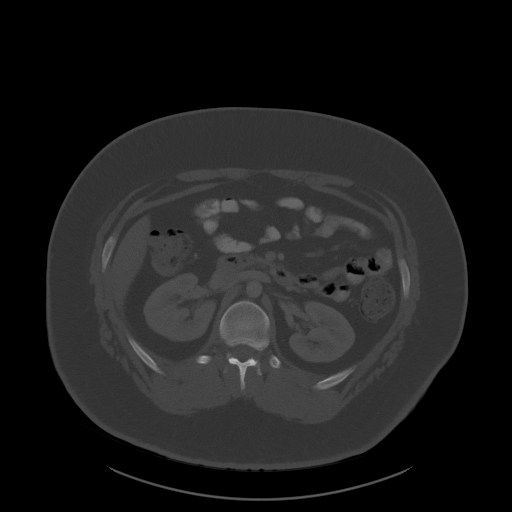
[im 78/111  soft-tissue]
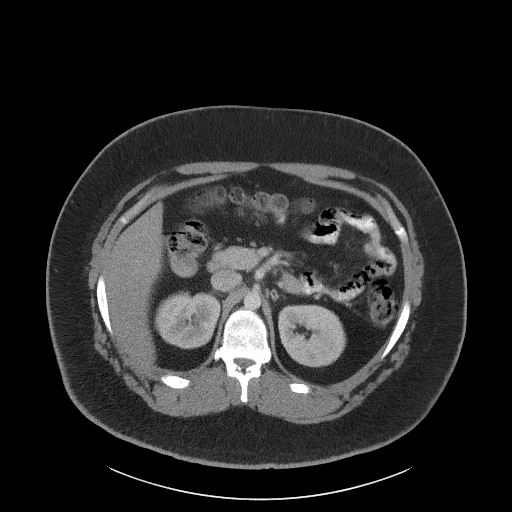
[im 88/111  soft-tissue]
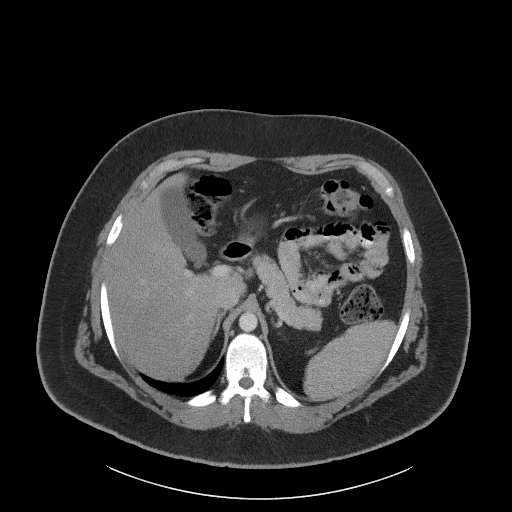
[im 97/111  soft-tissue]
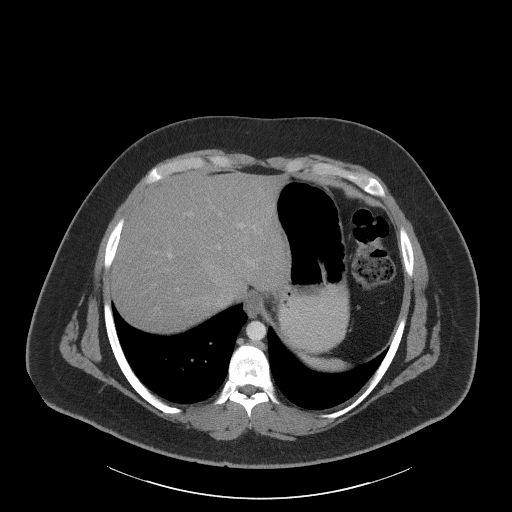
[im 106/111  soft-tissue]
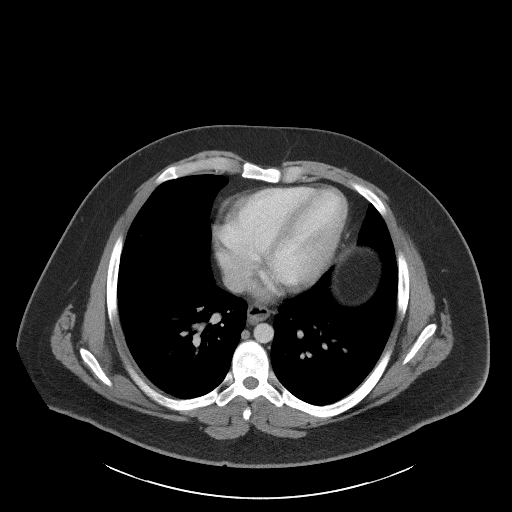

[Series 5: coronal st · coronal · 0.84mm/px · 3 of 107 slices shown]
[im 36/107  soft-tissue]
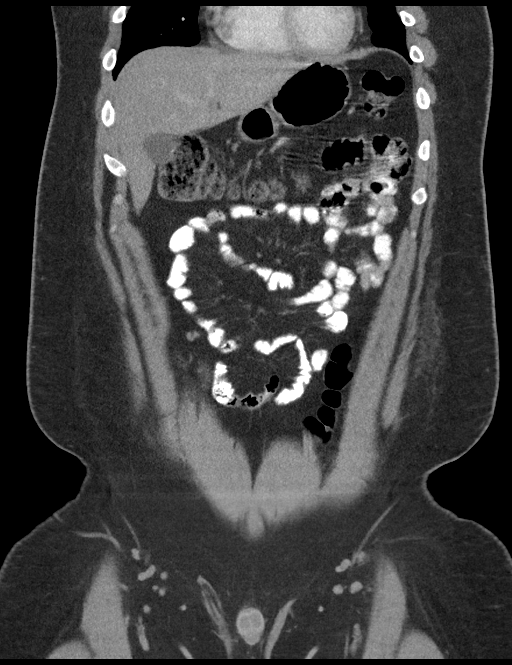
[im 48/107  soft-tissue]
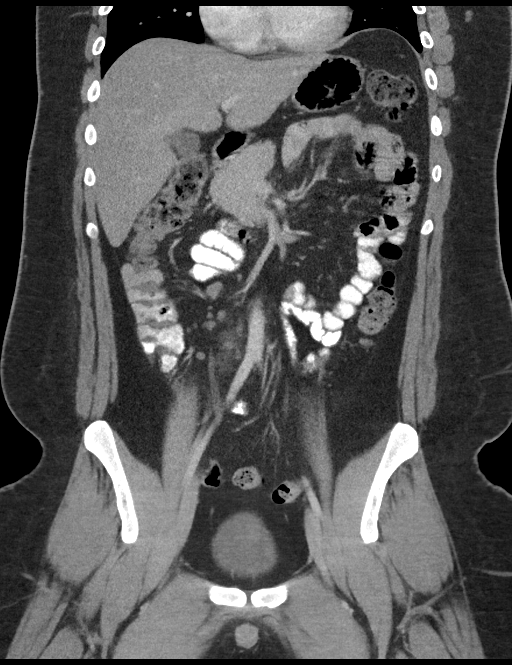
[im 59/107  soft-tissue]
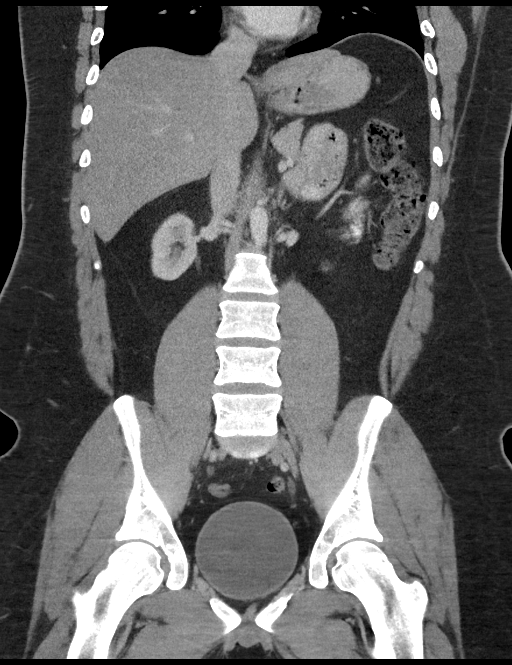

[16 of 46 positions shown; findings below may reference images not displayed]

FINDINGS: Lower chest: Visualized lung bases are clear.

Hepatobiliary: Diffuse hypoattenuation liver suggestive of
steatosis. Liver otherwise unremarkable. Gallbladder normal. No
biliary dilatation.

Pancreas: Pancreas within normal limits. Apparent subcentimeter
hypodensity at the pancreatic tail favored to reflect volume
averaging with adjacent peripancreatic fat.

Spleen: Spleen within normal limits.

Adrenals/Urinary Tract: Adrenal glands are normal. Kidneys equal in
size with symmetric enhancement. No nephrolithiasis, hydronephrosis,
or focal enhancing renal mass. No hydroureter. Bladder within normal
limits.

Stomach/Bowel: Stomach normal. No evidence for bowel obstruction.
Appendix within normal limits. No acute inflammatory changes seen
about the bowels.

Vascular/Lymphatic: Normal intravascular enhancement seen throughout
the intra-abdominal aorta and its branch vessels. No adenopathy.

Reproductive: Prostate normal.

Other: No free air or fluid. Tiny paraumbilical fat containing
hernia without associated inflammation.

Musculoskeletal: No acute osseous abnormality. No worrisome lytic or
blastic osseous lesions.
IMPRESSION: 1. No CT evidence for acute intra-abdominal or pelvic process.
2. Tiny fat containing paraumbilical hernia without associated
inflammation.
3. Hepatic steatosis.

## 2019-09-29 ENCOUNTER — Encounter: Payer: Self-pay | Admitting: Emergency Medicine

## 2019-09-29 ENCOUNTER — Other Ambulatory Visit: Payer: Self-pay

## 2019-09-29 ENCOUNTER — Emergency Department: Payer: No Typology Code available for payment source

## 2019-09-29 ENCOUNTER — Emergency Department
Admission: EM | Admit: 2019-09-29 | Discharge: 2019-09-29 | Disposition: A | Discharge status: Home / Self Care | Payer: No Typology Code available for payment source | Attending: Emergency Medicine | Admitting: Emergency Medicine

## 2019-09-29 DIAGNOSIS — I10 Essential (primary) hypertension: Secondary | ICD-10-CM | POA: Insufficient documentation

## 2019-09-29 DIAGNOSIS — M79631 Pain in right forearm: Secondary | ICD-10-CM

## 2019-09-29 DIAGNOSIS — Q796 Ehlers-Danlos syndrome, unspecified: Secondary | ICD-10-CM | POA: Diagnosis not present

## 2019-09-29 DIAGNOSIS — G5680 Other specified mononeuropathies of unspecified upper limb: Secondary | ICD-10-CM | POA: Insufficient documentation

## 2019-09-29 DIAGNOSIS — M79603 Pain in arm, unspecified: Secondary | ICD-10-CM | POA: Diagnosis present

## 2019-09-29 DIAGNOSIS — M25421 Effusion, right elbow: Secondary | ICD-10-CM

## 2019-09-29 DIAGNOSIS — G5611 Other lesions of median nerve, right upper limb: Secondary | ICD-10-CM

## 2019-09-29 DIAGNOSIS — Q7962 Hypermobile Ehlers-Danlos syndrome: Secondary | ICD-10-CM

## 2019-09-29 NOTE — ED Provider Notes (Addendum)
[facility] Emergency Department Provider Note   ____________________________________________   First MD Initiated Contact with Patient 09/29/19 1340     (approximate)  I have reviewed the triage vital signs and the nursing notes.   HISTORY  Chief Complaint Arm Pain    HPI Brad Parker is a 29 y.o. male patient that he was sent over by his physical therapist to have a DVT rule out from the right upper extremity.  Patient done a session he started developing increased swelling to the right upper extremity.  This occurred 5 days ago.  Patient said the swelling has resolved with no longer noticeable but continues to have pain in the right hand and arm.  Patient is a work related injury from approximately 2 months ago when his hand was crushed.  Patient has negative x-ray findings, negative MRI findings, and not responding to physical therapy.  Patient rates his pain as 8/10.  Patient described pain as "sore".         Past Medical History:  Diagnosis Date  . Allergic rhinitis   . Bilateral chronic knee pain   . Ehlers-Danlos syndrome type III   . Generalized anxiety disorder   . Hypermobile joints   . Hypertension   . Irregular breathing pattern   . Mid back pain   . Neck pain   . Neuropathy   . Numbness of feet   . Obesity (BMI 30.0-34.9)   . Patellar subluxation   . Radiculopathy of cervical region     Patient Active Problem List   Diagnosis Date Noted  . Pilonidal cyst 05/10/2016  . Umbilical hernia without obstruction and without gangrene 05/10/2016  . Raynaud's disease without gangrene 06/29/2015  . Irregular breathing pattern 02/10/2015  . Radiculopathy of cervical region 10/26/2014  . Bilateral chronic knee pain 07/25/2013  . Knee instability, right 07/25/2013  . Mid back pain 07/23/2013  . Neck pain 07/23/2013  . Thoracic kyphosis 07/23/2013  . Ehlers-Danlos syndrome 07/02/2013  . Neuropathy 06/16/2013  . Numbness of feet  05/01/2013  . Generalized anxiety disorder 03/10/2013  . Patellar subluxation 03/10/2013  . Knee instability 09/03/2012  . Instability of foot joint 06/17/2012  . Hypermobile joints 05/27/2012  . Obesity (BMI 30.0-34.9) 05/27/2012  . 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 Allergic rhinitis 03/17/2011    Past Surgical History:  Procedure Laterality Date  . MENISCUS REPAIR Bilateral Left- 05/2008 and Right 09/2008    Prior to Admission medications   Medication Sig Start Date End Date Taking? Authorizing Provider  budesonide (RHINOCORT ALLERGY) 32 MCG/ACT nasal spray Place 1 spray into both nostrils daily.    [provider]  desloratadine (CLARINEX) 5 MG tablet Take 5 mg by mouth daily.    [provider]  ibuprofen (ADVIL,MOTRIN) 200 MG tablet Take 400 mg by mouth every 6 (six) hours as needed for headache or mild pain.    [provider]  montelukast (SINGULAIR) 10 MG tablet Take 10 mg by mouth daily.    [provider]    Allergies Augmentin [amoxicillin-pot clavulanate] and Other  Family History  Problem Relation Age of Onset  . Ehlers-Danlos syndrome Mother   . Diabetes Father   . Colon cancer Sister   . Ehlers-Danlos syndrome Maternal Aunt     Social History Social History   Tobacco Use  . Smoking status: Never Smoker  . Smokeless tobacco: Never Used  Substance Use Topics  . Alcohol use: No  . Drug use:  No    Review of Systems Constitutional: No fever/chills Eyes: No visual changes. ENT: No sore throat. Cardiovascular: Denies chest pain. Respiratory: Denies shortness of breath. Gastrointestinal: No abdominal pain.  No nausea, no vomiting.  No diarrhea.  No constipation. Genitourinary: Negative for dysuria. Musculoskeletal: Negative for back pain. Skin: Negative for rash. Neurological: Negative for headaches, focal weakness or numbness. Psychiatric:  Anxiety Endocrine:  Hypertension Allergic/Immunilogical:  Augmentin ____________________________________________   PHYSICAL EXAM:  VITAL SIGNS: ED Triage Vitals  Enc Vitals Group     BP 09/29/19 1255 126/81     Pulse Rate 09/29/19 1255 (!) 102     Resp 09/29/19 1255 18     Temp 09/29/19 1255 98.2 F (36.8 C)     Temp Source 09/29/19 1255 Oral     SpO2 09/29/19 1255 100 %     Weight 09/29/19 1256 283 lb (128.4 kg)     Height 09/29/19 1256 6\' 2"  (1.88 m)     Head Circumference --      Peak Flow --      Pain Score 09/29/19 1256 8     Pain Loc --      Pain Edu? --      Excl. in GC? --     Constitutional: Alert and oriented. Well appearing and in no acute distress. Cardiovascular: Normal rate, regular rhythm. Grossly normal heart sounds.  Good peripheral circulation. Respiratory: Normal respiratory effort.  No retractions. Lungs CTAB. Musculoskeletal: No obvious deformity, edema, or erythema to the right extremity Neurologic:  Normal speech and language. No gross focal neurologic deficits are appreciated. No gait instability. Skin:  Skin is warm, dry and intact. No rash noted. Psychiatric: Mood and affect are normal. Speech and behavior are normal.  ____________________________________________   LABS (all labs ordered are listed, but only abnormal results are displayed)  Labs Reviewed - No data to display ____________________________________________  EKG   ____________________________________________  RADIOLOGY  ED MD interpretation:    Official radiology report(s): 10/01/19 Venous Img Upper Uni Right(DVT)  Result Date: 09/29/2019 CLINICAL DATA:  Right upper extremity forearm pain and swelling for the past 6 days. EXAM: RIGHT UPPER EXTREMITY VENOUS DOPPLER ULTRASOUND TECHNIQUE: Gray-scale sonography with graded compression, as well as color Doppler and duplex ultrasound were performed to evaluate the upper extremity deep venous system from the level of the subclavian vein and including the jugular, axillary, basilic, radial, ulnar  and upper cephalic vein. Spectral Doppler was utilized to evaluate flow at rest and with distal augmentation maneuvers. COMPARISON:  None. FINDINGS: Contralateral Subclavian Vein: Respiratory phasicity is normal and symmetric with the symptomatic side. No evidence of thrombus. Normal compressibility. Internal Jugular Vein: No evidence of thrombus. Normal compressibility, respiratory phasicity and response to augmentation. Subclavian Vein: No evidence of thrombus. Normal compressibility, respiratory phasicity and response to augmentation. Axillary Vein: No evidence of thrombus. Normal compressibility, respiratory phasicity and response to augmentation. Cephalic Vein: No evidence of thrombus. Normal compressibility, respiratory phasicity and response to augmentation. Basilic Vein: No evidence of thrombus. Normal compressibility, respiratory phasicity and response to augmentation. Brachial Veins: No evidence of thrombus. Normal compressibility, respiratory phasicity and response to augmentation. Radial Veins: No evidence of thrombus. Normal compressibility, respiratory phasicity and response to augmentation. Ulnar Veins: No evidence of thrombus. Normal compressibility, respiratory phasicity and response to augmentation. Venous Reflux:  None visualized. Other Findings:  None visualized. IMPRESSION: No evidence of DVT within the right upper extremity. Electronically Signed   By: 10/01/2019 M.D.   On: 09/29/2019  14:26    ____________________________________________   PROCEDURES  Procedure(s) performed (including Critical Care):  Procedures   ____________________________________________   INITIAL IMPRESSION / ASSESSMENT AND PLAN / ED COURSE  As part of my medical decision making, I reviewed the following data within the electronic MEDICAL RECORD NUMBER     Patient presents with 2 and half months of right upper extremity pain secondary to contusion of the right hand.  Physical exam is unremarkable  patient complain of pain.  Discussed negative DVT findings.  Due to the patient's showing no improvement since injury, conflicting medical interpretation of his complaint, and duration of complaint advised to request his insurance company perform MMI after evaluation by neurology.   Lynita Lombard was evaluated in Emergency Department on 09/29/2019 for the symptoms described in the history of present illness. He was evaluated in the context of the global COVID-19 pandemic, which necessitated consideration that the patient might be at risk for infection with the SARS-CoV-2 virus that causes COVID-19. Institutional protocols and algorithms that pertain to the evaluation of patients at risk for COVID-19 are in a state of rapid change based on information released by regulatory bodies including the CDC and federal and state organizations. These policies and algorithms were followed during the patient's care in the ED.       ____________________________________________   FINAL CLINICAL IMPRESSION(S) / ED DIAGNOSES  Final diagnoses:  Ehlers-Danlos syndrome type III  Anterior interosseous nerve syndrome, right     ED Discharge Orders    None       Note:  This document was prepared using Dragon voice recognition software and may include unintentional dictation errors.    Joni Reining, PA-C 09/29/19 1519    Joni Reining, PA-C 09/29/19 1552    Delton [place], MD 09/29/19 [phone]

## 2019-09-29 NOTE — ED Notes (Signed)
See triage note  States he has an injury to right arm/hand   States he has been going to PT  PT sen him here for an U/S    States he needed to r/oclot

## 2019-09-29 NOTE — ED Notes (Addendum)
Visitor at bedside states they were not leaving until they speak with a doctor and find out why the patient is in pain. Ron Katrinka Blazing PA-C informed.

## 2019-09-29 NOTE — Discharge Instructions (Addendum)
Your CT of the right upper extremity was unremarkable.  Follow-up with neurology.  Based on the patient's duration of complaint, conflicting medical interpretations of injury, and no significant medical improvement since injury recommend requesting MMI from [employer]. insurance.

## 2019-09-29 NOTE — ED Triage Notes (Signed)
Pt had hand injury at work and has been doing PT.  Has had different type of pain to right forearm since last week and reports some mild swelling. His PT told him to come to ED to r/o DVT in arm.  NAD. VSS

## 2019-10-07 ENCOUNTER — Encounter: Payer: Self-pay | Admitting: Neurology

## 2020-01-12 ENCOUNTER — Ambulatory Visit: Payer: Self-pay | Admitting: Neurology

## 2020-01-13 ENCOUNTER — Encounter: Payer: Self-pay | Admitting: Neurology

## 2021-02-03 ENCOUNTER — Ambulatory Visit: Payer: No Typology Code available for payment source | Admitting: Orthopedic Surgery

## 2022-08-27 IMAGING — US US EXTREM  UP VENOUS*R*
1 series · 13 of 24 positions shown · non-contrast
Comparison: None.

CLINICAL DATA: Right upper extremity forearm pain and swelling for
the past 6 days.



[Series 1: us venous img upper uni right (dvt) · portal-venous · 13 of 34 slices shown]
[im 1/34]
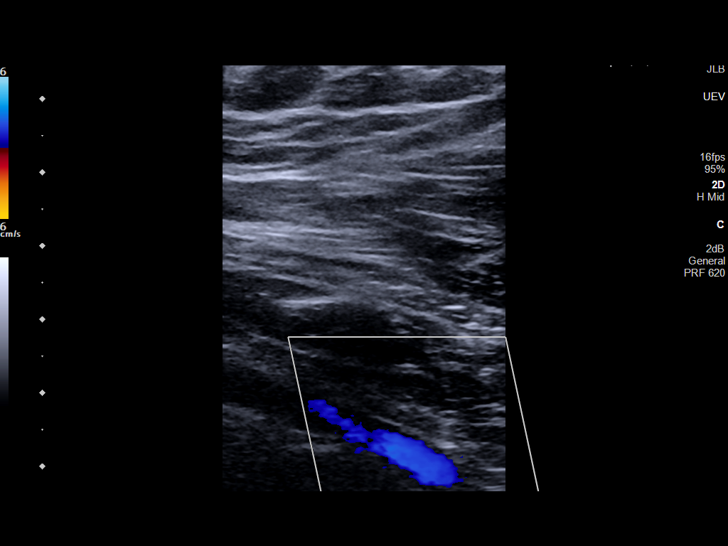
[im 3/34]
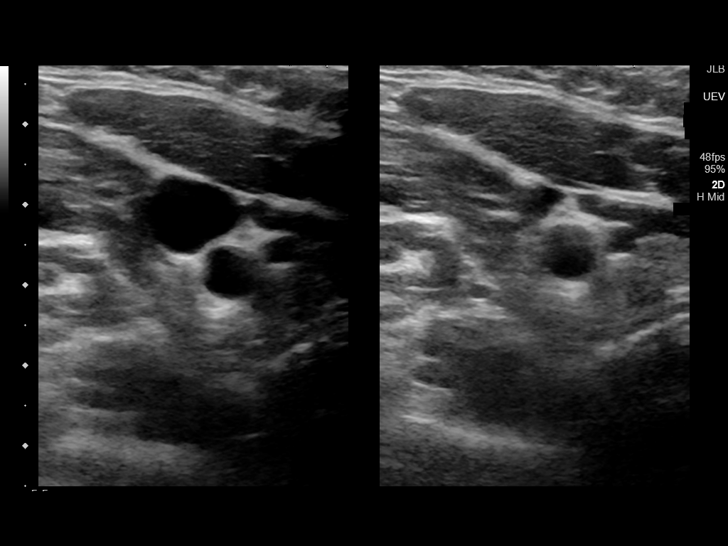
[im 6/34]
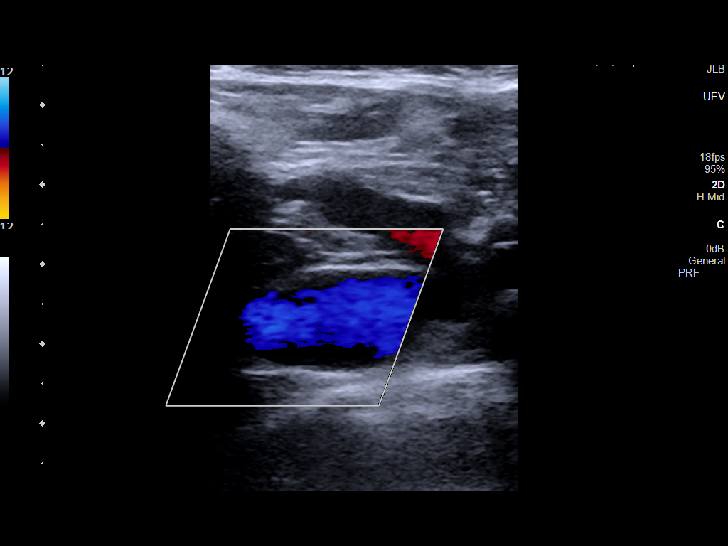
[im 9/34]
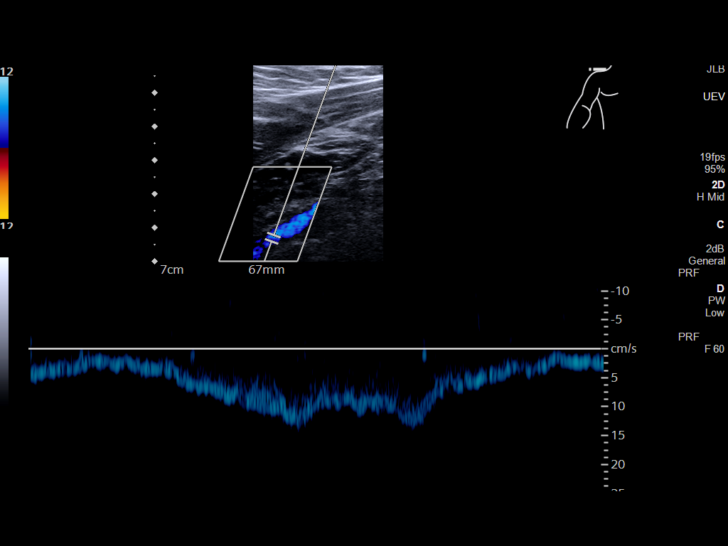
[im 12/34]
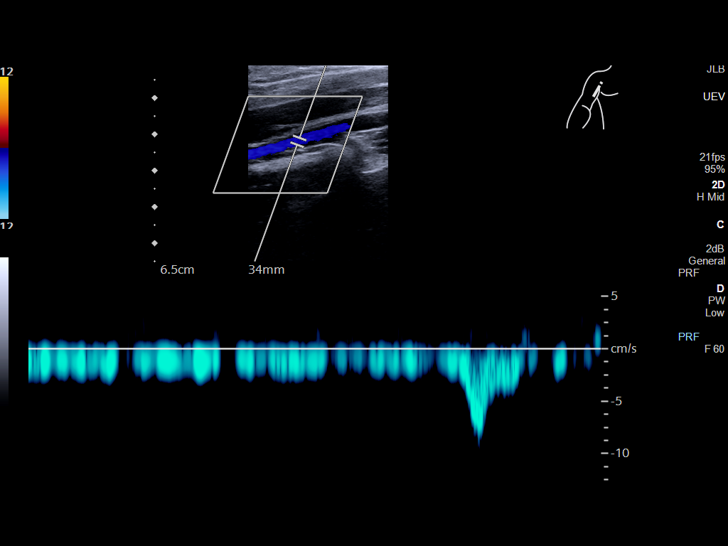
[im 15/34]
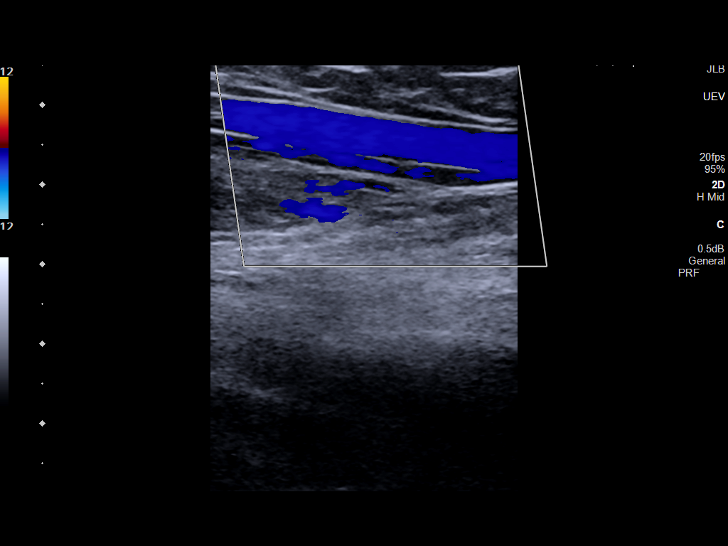
[im 18/34]
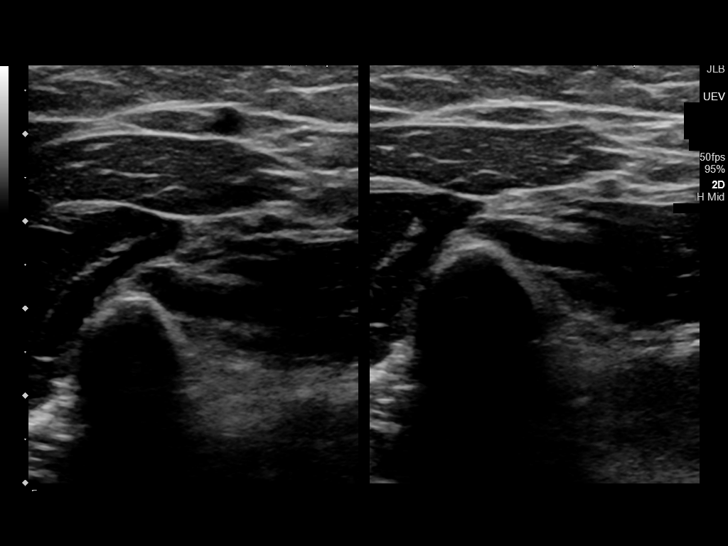
[im 19/34]
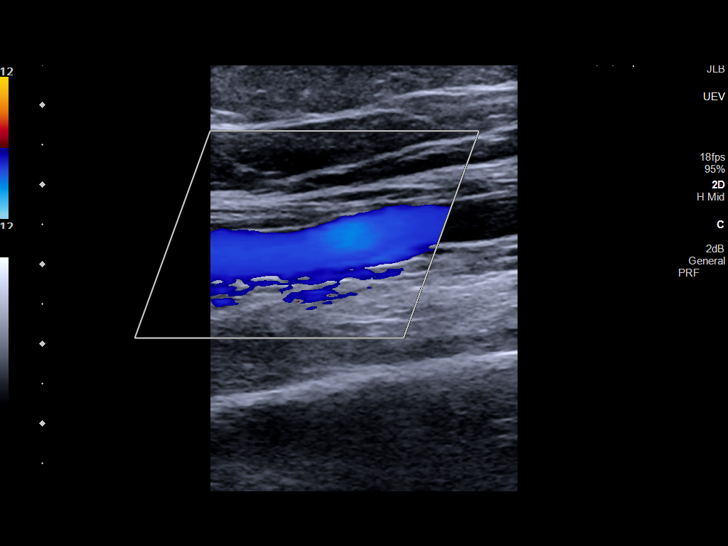
[im 22/34]
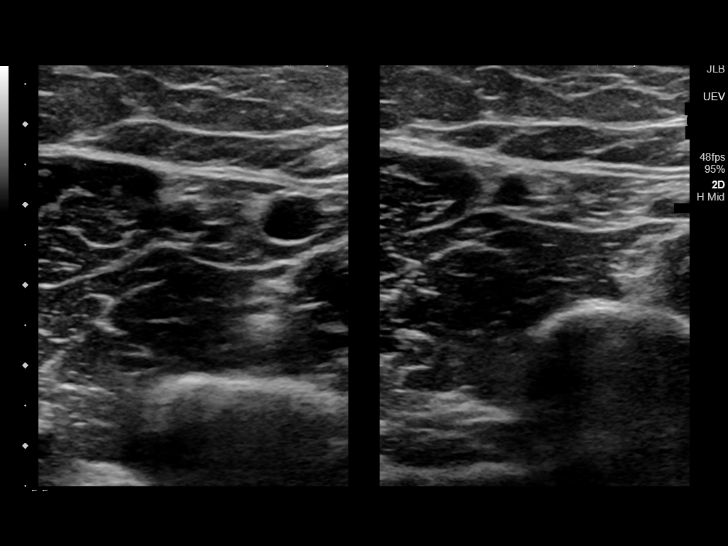
[im 25/34]
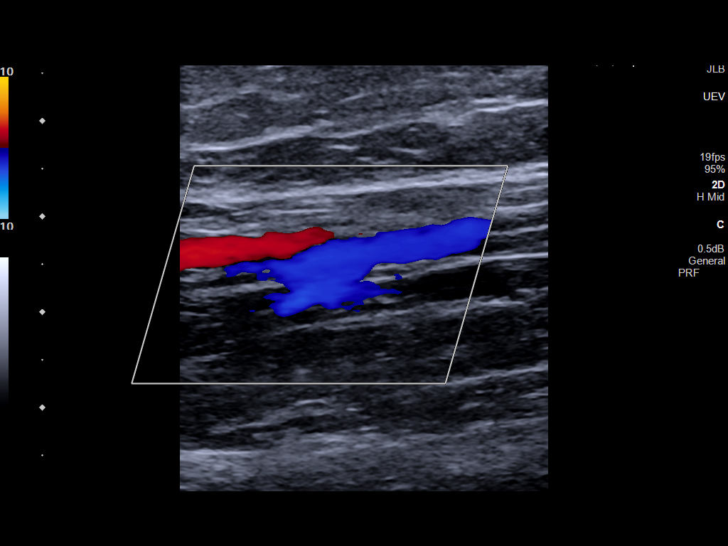
[im 28/34]
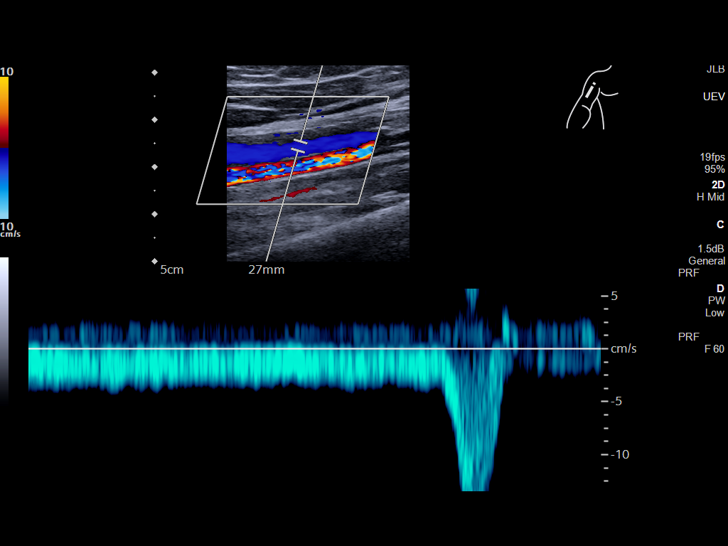
[im 31/34]
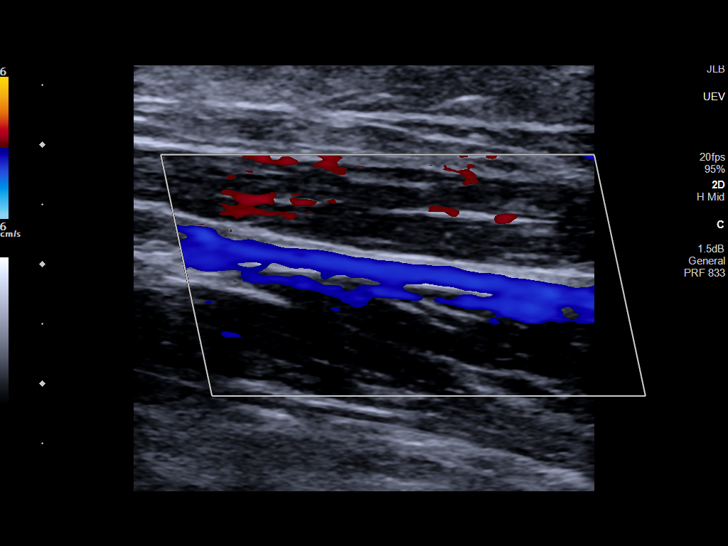
[im 34/34]
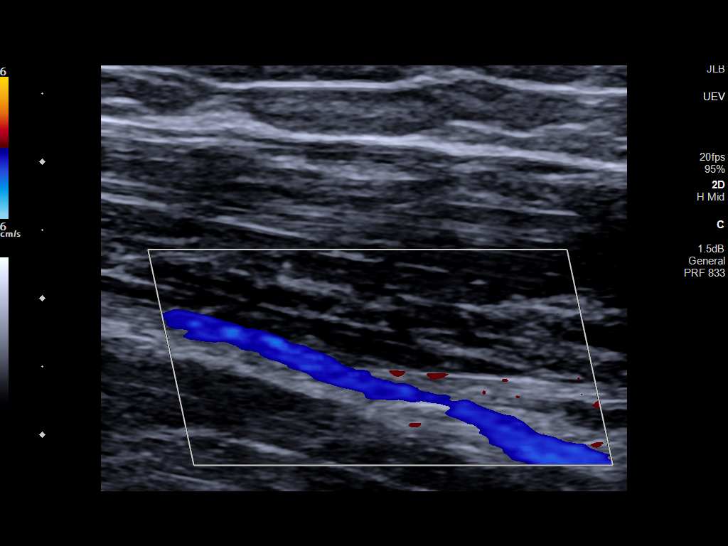

[13 of 24 positions shown; findings below may reference images not displayed]

FINDINGS: Contralateral Subclavian Vein: Respiratory phasicity is normal and
symmetric with the symptomatic side. No evidence of thrombus. Normal
compressibility.

Internal Jugular Vein: No evidence of thrombus. Normal
compressibility, respiratory phasicity and response to augmentation.

Subclavian Vein: No evidence of thrombus. Normal compressibility,
respiratory phasicity and response to augmentation.

Axillary Vein: No evidence of thrombus. Normal compressibility,
respiratory phasicity and response to augmentation.

Cephalic Vein: No evidence of thrombus. Normal compressibility,
respiratory phasicity and response to augmentation.

Basilic Vein: No evidence of thrombus. Normal compressibility,
respiratory phasicity and response to augmentation.

Brachial Veins: No evidence of thrombus. Normal compressibility,
respiratory phasicity and response to augmentation.

Radial Veins: No evidence of thrombus. Normal compressibility,
respiratory phasicity and response to augmentation.

Ulnar Veins: No evidence of thrombus. Normal compressibility,
respiratory phasicity and response to augmentation.

Venous Reflux:  None visualized.

Other Findings:  None visualized.
IMPRESSION: No evidence of DVT within the right upper extremity.
